# Patient Record
Sex: Male | Born: 1990 | Race: Black or African American | Hispanic: No | Marital: Married | Smoking: Former smoker
Health system: Southern US, Community
[De-identification: ages and names within clinical notes are randomized; demographics above are authoritative.]

## PROBLEM LIST (undated history)

## (undated) ENCOUNTER — Emergency Department (HOSPITAL_COMMUNITY): Payer: BLUE CROSS/BLUE SHIELD | Source: Home / Self Care

## (undated) DIAGNOSIS — R9431 Abnormal electrocardiogram [ECG] [EKG]: Secondary | ICD-10-CM

## (undated) HISTORY — DX: Abnormal electrocardiogram (ECG) (EKG): R94.31

## (undated) HISTORY — PX: ORCHIECTOMY: SHX2116

## (undated) HISTORY — PX: HERNIA REPAIR: SHX51

---

## 2015-08-30 NOTE — Progress Notes (Signed)
     HPI: 25 year old male for evaluation of abnormal electrocardiogram. 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 He was previously followed at [facility]. He went to school in [place] and now lives here. He presents for further evaluation. He has no symptoms. There is no dyspnea on exertion, orthopnea, PND, pedal edema, syncope or chest pain. He has an occasional brief flutter but no sustained palpitations.  No current outpatient prescriptions on file.   No current facility-administered medications for this visit.    No Known Allergies   Past Medical History  Diagnosis Date  . Abnormal ECG     Past Surgical History  Procedure Laterality Date  . Hernia repair      Social History   Social History  . Marital Status: Single    Spouse Name: N/A  . Number of Children: N/A  . Years of Education: N/A   Occupational History  . Not on file.   Social History Main Topics  . Smoking status: Former Research scientist (life sciences)  . Smokeless tobacco: Never Used  . Alcohol Use: 0.0 oz/week    0 Standard drinks or equivalent per week     Comment: Occasional  . Drug Use: No  . Sexual Activity: Not on file   Other Topics Concern  . Not on file   Social History Narrative  . No narrative on file    Family History  Problem Relation Age of Onset  . Hypertension Mother   . Hypertension Father   . Heart disease Mother     Palpitations    ROS: no fevers or chills, productive cough, hemoptysis, dysphasia, odynophagia, melena, hematochezia, dysuria, hematuria, rash, seizure activity, orthopnea, PND, pedal edema, claudication. Remaining systems are negative.  Physical Exam:   Blood pressure 124/86, pulse 69, height 5\' 9"  (1.753 m), weight 201 lb (91.173 kg).  General:  Well developed/well nourished in NAD Skin warm/dry, tattoos Patient not depressed No peripheral clubbing Back-normal HEENT-normal/normal eyelids Neck supple/normal carotid upstroke bilaterally; no  bruits; no JVD; no thyromegaly chest - CTA/ normal expansion CV - RRR/normal S1 and S2; no murmurs, rubs or gallops;  PMI nondisplaced Abdomen -NT/ND, no HSM, no mass, + bowel sounds, no bruit 2+ femoral pulses, no bruits Ext-no edema, chords, 2+ DP Neuro-grossly nonfocal  ECG Sinus rhythm at a rate of 69. First-degree AV block with markedly prolonged PR interval of 0.4 ms.

## 2015-09-01 ENCOUNTER — Encounter: Payer: Self-pay | Admitting: Cardiology

## 2015-09-01 ENCOUNTER — Ambulatory Visit (INDEPENDENT_AMBULATORY_CARE_PROVIDER_SITE_OTHER): Payer: BLUE CROSS/BLUE SHIELD | Admitting: Cardiology

## 2015-09-01 VITALS — BP 124/86 | HR 69 | Ht 69.0 in | Wt 201.0 lb

## 2015-09-01 DIAGNOSIS — R9431 Abnormal electrocardiogram [ECG] [EKG]: Secondary | ICD-10-CM

## 2015-09-01 DIAGNOSIS — I440 Atrioventricular block, first degree: Secondary | ICD-10-CM | POA: Diagnosis not present

## 2015-09-01 NOTE — Patient Instructions (Addendum)
Medication Instructions:   NONE ORDER TODAY   If you need a refill on your cardiac medications before your next appointment, please call your pharmacy.  Labwork: NONE ORDER TODAY    Testing/Procedures:  Your physician has requested that you have an echocardiogram. Echocardiography is a painless test that uses sound waves to create images of your heart. It provides your doctor with information about the size and shape of your heart and how well your heart's chambers and valves are working. This procedure takes approximately one hour. There are no restrictions for this procedure.   Follow-Up: Your physician wants you to follow-up in: [place] will receive a reminder letter in the mail two months in advance. If you don't receive a letter, please call our office to schedule the follow-up appointment.     Any Other Special Instructions Will Be Listed Below (If Applicable).

## 2015-09-01 NOTE — Assessment & Plan Note (Signed)
Patient has a markedly prolonged PR interval. However he is having no symptoms. There is no history of syncope. He was followed at [facility] and apparently had echoes and monitors in the past that were unremarkable. We will obtain all previous records. I will plan to repeat echocardiogram. Given that it has been present for approximately 10 years it is unlikely that he has sarcoid, hemochromatosis, Lyme disease or other such contributing factors.

## 2015-09-13 ENCOUNTER — Other Ambulatory Visit: Payer: Self-pay

## 2015-09-13 ENCOUNTER — Other Ambulatory Visit (HOSPITAL_COMMUNITY): Payer: Self-pay

## 2015-09-13 ENCOUNTER — Ambulatory Visit (HOSPITAL_COMMUNITY): Payer: BLUE CROSS/BLUE SHIELD | Attending: Cardiology

## 2015-09-13 DIAGNOSIS — R9431 Abnormal electrocardiogram [ECG] [EKG]: Secondary | ICD-10-CM | POA: Diagnosis not present

## 2015-09-15 ENCOUNTER — Encounter: Payer: Self-pay | Admitting: Cardiology

## 2015-09-15 NOTE — Telephone Encounter (Signed)
Pt would like echo results from 09-13-15 please.

## 2015-09-15 NOTE — Telephone Encounter (Signed)
This encounter was created in error - please disregard.

## 2015-11-15 ENCOUNTER — Encounter (HOSPITAL_COMMUNITY): Payer: Self-pay | Admitting: *Deleted

## 2015-11-15 ENCOUNTER — Emergency Department (HOSPITAL_COMMUNITY): Payer: Worker's Compensation

## 2015-11-15 ENCOUNTER — Emergency Department (HOSPITAL_COMMUNITY)
Admission: EM | Admit: 2015-11-15 | Discharge: 2015-11-15 | Disposition: A | Discharge status: Home / Self Care | Payer: Worker's Compensation | Attending: Emergency Medicine | Admitting: Emergency Medicine

## 2015-11-15 DIAGNOSIS — W208XXA Other cause of strike by thrown, projected or falling object, initial encounter: Secondary | ICD-10-CM | POA: Insufficient documentation

## 2015-11-15 DIAGNOSIS — Y9289 Other specified places as the place of occurrence of the external cause: Secondary | ICD-10-CM | POA: Insufficient documentation

## 2015-11-15 DIAGNOSIS — Y9389 Activity, other specified: Secondary | ICD-10-CM | POA: Diagnosis not present

## 2015-11-15 DIAGNOSIS — Z87891 Personal history of nicotine dependence: Secondary | ICD-10-CM | POA: Insufficient documentation

## 2015-11-15 DIAGNOSIS — Y990 Civilian activity done for income or pay: Secondary | ICD-10-CM | POA: Insufficient documentation

## 2015-11-15 DIAGNOSIS — S68119A Complete traumatic metacarpophalangeal amputation of unspecified finger, initial encounter: Secondary | ICD-10-CM

## 2015-11-15 DIAGNOSIS — S68120A Partial traumatic metacarpophalangeal amputation of right index finger, initial encounter: Secondary | ICD-10-CM | POA: Insufficient documentation

## 2015-11-15 DIAGNOSIS — Z23 Encounter for immunization: Secondary | ICD-10-CM | POA: Insufficient documentation

## 2015-11-15 DIAGNOSIS — S68129A Partial traumatic metacarpophalangeal amputation of unspecified finger, initial encounter: Secondary | ICD-10-CM

## 2015-11-15 LAB — CBC WITH DIFFERENTIAL/PLATELET
BASOS ABS: 0 10*3/uL (ref 0.0–0.1)
Basophils Relative: 0 %
EOS PCT: 0 %
Eosinophils Absolute: 0 10*3/uL (ref 0.0–0.7)
HEMATOCRIT: 45.1 % (ref 39.0–52.0)
HEMOGLOBIN: 15.6 g/dL (ref 13.0–17.0)
LYMPHS PCT: 28 %
Lymphs Abs: 1.4 10*3/uL (ref 0.7–4.0)
MCH: 31.1 pg (ref 26.0–34.0)
MCHC: 34.6 g/dL (ref 30.0–36.0)
MCV: 90 fL (ref 78.0–100.0)
MONO ABS: 0.3 10*3/uL (ref 0.1–1.0)
MONOS PCT: 6 %
Neutro Abs: 3.4 10*3/uL (ref 1.7–7.7)
Neutrophils Relative %: 66 %
Platelets: 140 10*3/uL — ABNORMAL LOW (ref 150–400)
RBC: 5.01 MIL/uL (ref 4.22–5.81)
RDW: 12.8 % (ref 11.5–15.5)
WBC: 5.2 10*3/uL (ref 4.0–10.5)

## 2015-11-15 LAB — BASIC METABOLIC PANEL
ANION GAP: 10 (ref 5–15)
BUN: 15 mg/dL (ref 6–20)
CHLORIDE: 104 mmol/L (ref 101–111)
CO2: 26 mmol/L (ref 22–32)
Calcium: 9.8 mg/dL (ref 8.9–10.3)
Creatinine, Ser: 1.26 mg/dL — ABNORMAL HIGH (ref 0.61–1.24)
GFR calc Af Amer: >60 mL/min (ref >60)
GLUCOSE: 98 mg/dL (ref 65–99)
POTASSIUM: 4.7 mmol/L (ref 3.5–5.1)
Sodium: 140 mmol/L (ref 135–145)

## 2015-11-15 MED ORDER — SODIUM CHLORIDE 0.9 % IV BOLUS (SEPSIS)
1000.0000 mL | Freq: Once | INTRAVENOUS | Status: AC
  Administered 2015-11-15: 1000 mL via INTRAVENOUS

## 2015-11-15 MED ORDER — MORPHINE SULFATE (PF) 2 MG/ML IV SOLN
2.0000 mg | Freq: Once | INTRAVENOUS | Status: AC
  Administered 2015-11-15: 2 mg via INTRAVENOUS
  Filled 2015-11-15: qty 1

## 2015-11-15 MED ORDER — OXYCODONE-ACETAMINOPHEN 5-325 MG PO TABS
2.0000 | ORAL_TABLET | Freq: Once | ORAL | Status: AC
  Administered 2015-11-15: 2 via ORAL
  Filled 2015-11-15: qty 2

## 2015-11-15 MED ORDER — OXYCODONE HCL 5 MG PO TABS
5.0000 mg | ORAL_TABLET | Freq: Once | ORAL | Status: AC
  Administered 2015-11-15: 5 mg via ORAL
  Filled 2015-11-15: qty 1

## 2015-11-15 MED ORDER — CEFAZOLIN SODIUM-DEXTROSE 2-4 GM/100ML-% IV SOLN
2.0000 g | Freq: Once | INTRAVENOUS | Status: AC
  Administered 2015-11-15: 2 g via INTRAVENOUS
  Filled 2015-11-15: qty 100

## 2015-11-15 MED ORDER — TETANUS-DIPHTH-ACELL PERTUSSIS 5-2.5-18.5 LF-MCG/0.5 IM SUSP
0.5000 mL | Freq: Once | INTRAMUSCULAR | Status: AC
  Administered 2015-11-15: 0.5 mL via INTRAMUSCULAR
  Filled 2015-11-15: qty 0.5

## 2015-11-15 MED ORDER — LIDOCAINE HCL (CARDIAC) 20 MG/ML IV SOLN: 1.5000 mg/kg | Freq: Once | INTRAVENOUS | Status: DC

## 2015-11-15 MED ORDER — LIDOCAINE HCL 2 % IJ SOLN
15.0000 mL | Freq: Once | INTRAMUSCULAR | Status: AC
  Administered 2015-11-15: 300 mg
  Filled 2015-11-15: qty 20

## 2015-11-15 NOTE — ED Provider Notes (Signed)
CSN: [id number]     Arrival date & time 11/15/15  1826 History  By signing my name below, I, Nicole Kindred, attest that this documentation has been prepared under the direction and in the presence of Lorayne Bender, PA-C  Electronically Signed: Nicole Kindred, ED Scribe 11/15/2015 at 9:50 PM.   Chief Complaint  Patient presents with  . Finger Injury   The history is provided by the patient. No language interpreter was used.   HPI Comments: Victor Benton is a 25 y.o. male who presents to the Emergency Department complaining of sudden onset, right finger injury that occurred about an hour prior to arrival. Pt states his finger was smashed by a steel bundle at work. Pt reports associated pain to the finger. No other associated symptoms noted. No worsening or alleviating factors noted. Pt denies other injuries, neuro deficits, or any other complaints.   Past Medical History  Diagnosis Date  . Abnormal ECG    Past Surgical History  Procedure Laterality Date  . Hernia repair     Family History  Problem Relation Age of Onset  . Hypertension Mother   . Hypertension Father   . Heart disease Mother     Palpitations   Social History  Substance Use Topics  . Smoking status: Former Research scientist (life sciences)  . Smokeless tobacco: Never Used  . Alcohol Use: 0.0 oz/week    0 Standard drinks or equivalent per week     Comment: Occasional    Review of Systems  Skin: Positive for wound.  Neurological: Negative for numbness.    Allergies  Review of patient's allergies indicates no known allergies.  Home Medications   Prior to Admission medications   Not on File   BP 122/56 mmHg  Pulse 74  Temp(Src) 98.9 F (37.2 C) (Oral)  Resp 20  Wt 87.544 kg  SpO2 99% Physical Exam  Constitutional: He is oriented to person, place, and time. He appears well-developed and well-nourished. No distress.  HENT:  Head: Normocephalic and atraumatic.  Eyes: Conjunctivae are normal.  Neck: Neck supple.   Cardiovascular: Normal rate and regular rhythm.   Pulmonary/Chest: Effort normal.  Abdominal: He exhibits no distension.  Musculoskeletal:  Complete amputation of right second digit distal to DIP joint. Tip of the phalange exposed. Bleeding is controlled. Range of motion is intact in the DIP joint. Circulation is intact. No injury noted to the patient's hand.  Neurological: He is alert and oriented to person, place, and time.  Sensory intact.  Skin: Skin is warm and dry. He is not diaphoretic.  Psychiatric: He has a normal mood and affect.  Nursing note and vitals reviewed.   ED Course  Procedures (including critical care time) DIAGNOSTIC STUDIES: Oxygen Saturation is 100% on RA, normal by my interpretation.    COORDINATION OF CARE: 6:50 PM-Discussed treatment plan which includes CBC with differential, BMP, DG hand complete right with pt at bedside and pt agreed to plan.   Labs Review Labs Reviewed  CBC WITH DIFFERENTIAL/PLATELET - Abnormal; Notable for the following:    Platelets 140 (*)    All other components within normal limits  BASIC METABOLIC PANEL - Abnormal; Notable for the following:    Creatinine, Ser 1.26 (*)    All other components within normal limits    Imaging Review Dg Hand Complete Right  11/15/2015  CLINICAL DATA:  Partial amputation to tip of second finger of right hand after injury from heavy steel object. Initial encounter. EXAM: RIGHT HAND - COMPLETE  3+ VIEW COMPARISON:  None. FINDINGS: There is circumferential amputation of the soft tissues of the distal right second finger. The tuft of the distal phalanx is intact and exposed to air. There is no visible fracture or soft tissue foreign body. The rest of the hand is normal. Probable old injury at the tip of the ulnar styloid. IMPRESSION: Circumferential amputation of the distal soft tissues of the right second finger without fracture or soft tissue foreign body. The tuft of the distal phalanx is exposed to air.  Electronically Signed   By: Aletta Edouard M.D.   On: 11/15/2015 19:24   I have personally reviewed and evaluated these images and lab results as part of my medical decision-making.   EKG Interpretation None           MDM  Treatment plan includes DG hand complete right, CBC with differential, BMP, percocet/roxicet, and Tdap.    Final diagnoses:  Amputation of finger tip, initial encounter    Victor Benton presents with a partial finger amputation that occurred about an hour prior to arrival.   Findings and plan of care discussed with Charlesetta Shanks, MD.   Patient is a nonsmoker. Tetanus updated here in the ED. Fingertip was placed in a sterile vessel and cooled externally with ice.Tip of the finger was covered with Xeroform gauze. Labs obtained in anticipation of possible surgery. 7:33 PM Spoke with Dr. Amedeo Plenty, hand surgeon, who advised that he will be by to look at the patient's wound. Recommended IV and 2 g Ancef. No further instructions. Patient given prescriptions for Cipro and OxyIR. Patient to follow up with Dr. Amedeo Plenty in the office in 10-14 days. Dr. Vanetta Shawl office will call the patient to set up an appointment. Wound care and return precautions discussed. Patient voiced understanding of these instructions and is comfortable with discharge.  Filed Vitals:   11/15/15 1859 11/15/15 2148  BP: 153/80 122/56  Pulse: 63 74  Temp: 98.5 F (36.9 C) 98.9 F (37.2 C)  TempSrc: Oral Oral  Resp: 18 20  Weight: 87.544 kg   SpO2: 100% 99%     I personally performed the services described in this documentation, which was scribed in my presence. The recorded information has been reviewed and is accurate.   Lorayne Bender, PA-C 11/15/15 2150  Charlesetta Shanks, MD 11/16/15 [phone]

## 2015-11-15 NOTE — Discharge Instructions (Signed)
You have been seen today for a finger tip amputation. Your wound was repaired here in the ED by the hand surgeon, Dr. Amedeo Plenty. You should see Dr. Amedeo Plenty in the office in 10-14 days. His office will call you to set up this appointment. Take prescriptions as prescribed by Dr. Amedeo Plenty: Cipro, an antibiotic, and Oxycodone IR, a pain medication. Do not drive or perform other dangerous activities while using the oxycodone. Keep the area clean and dry. Follow up with PCP as needed. Return to ED should symptoms worsen.  RESOURCE GUIDE  Chronic Pain Problems: Contact [facility]  [phone] Patients need to be referred by their primary care doctor.  Insufficient Money for Medicine: Contact United Way:  call "211" or [place] [phone].  No Primary Care Doctor: - Call Health Connect  [phone] - can help you locate a primary care doctor that  accepts your insurance, provides certain services, etc. - Physician Referral Service- [phone]  Agencies that provide inexpensive medical care: - [facility]  [facility]  [phone] - [facility]  [phone] - [facility]  [phone] - Planned Parenthood  [phone] - [facility]  [phone]  [place] Providers: - [facility]- [address]  [phone], Mon-Fri 9am-7pm, Sat 9am-1pm - [place], [address] [place]  [place], [address] [place]  [place]- [address]  [place], [address], [phone]  Only accepts [place] patients after they have their name  applied to their card  Self Pay (no insurance) in [place]: - Sickle Cell Patients: Dr Kevan Ny, [facility]  [place], [facility]- [place]  [place] [facility] [place]- [id number] [place] [address], [facility]- see information above (Speak to [employer] if you do not have insurance)       -  [facility]- [place], [place],  [place], [place]  Dr Vista Lawman-  [address], [place], [place] [facility]- [address] [phone]       -  [facility]- [address], [place], also [address] [phone]       -    [facility]- [address], [place], 1st & 3rd Saturday   every month, 10am-1pm  1) Find a Doctor and Pay Out of Pocket Although you won't have to find out who is covered by your insurance plan, it is a good idea to ask around and get recommendations. You will then need to call the office and see if the doctor you have chosen will accept you as a new patient and what types of options they offer for patients who are self-pay. Some doctors offer discounts or will set up payment plans for their patients  who do not have insurance, but you will need to ask so you aren't surprised when you get to your appointment.  2) Contact Your Local Health Department Not all health departments have doctors that can see patients for sick visits, but many do, so it is worth a call to see if yours does. If you don't know where your local health department is, you can check in your phone book. The CDC also has a tool to help you locate your state's health department, and many state websites also have listings of all of their local health departments.  3) Find a [facility] If your illness is not likely to be very severe or complicated, you may want to try a walk in clinic. These are popping up all over the country in pharmacies, drugstores, and shopping  centers. They're usually staffed by nurse practitioners or physician assistants that have been trained to treat common illnesses and complaints. They're usually fairly quick and inexpensive. However, if you have serious medical issues or chronic medical problems, these are probably not your best option  STD Testing - [place], [facility], [address], [place], phone [phone] or [phone].  Monday - Friday, call for an appointment. - [place], [facility] Clinic, [address], [place], phone [phone] or [phone].  Monday - Friday, call for an appointment.  Abuse/Neglect: - [place] [phone] - [place] [phone] (After Hours)  Emergency Shelter:  [facility] [phone]  Maternity Homes: - Room at the [place] [phone] - [place] [phone]  MRSA Hotline #:   (865)778-7757  [facility] [place] Dept. [address]         [place] Phone:  [phone]                                  Phone:  [phone]                   Phone:  [phone]  [place], [place] in [place], [address]                                  [place] [phone] or [phone] (After Hours)   [place]  Substance Abuse Resources: - Alcohol and Drug Services  [place] [phone] - [facility]  [place] - Residential & Outpatient Substance Abuse Program  [phone]  Psychological Services: - [place]  [place], [place] [address], [place], [place]: [phone] or [phone], [employer]  Dental Assistance  If unable to pay or uninsured, contact:  [facility] or [facility]. to become qualified for the adult dental clinic.  Patients with Medicaid: [facility] [id number], [place] [address], [phone]  If unable to pay, or uninsured, contact [facility] [phone]) or [place] [phone] in [place], [place] in [facility]) to become qualified for the adult dental clinic   Other [place]- [place], [place], [place], [address], [place], [place], 2nd and 4th Thursday of the month at 6:30am.  10 clients each day by appointment, can sometimes see walk-in patients if someone does not show for an appointment. [facility]- [address] [place], [place], [address], [place], [place], [place], [address], [place] Department- [place] Department- [employer] Department- [phone]

## 2015-11-15 NOTE — ED Notes (Signed)
Pt cut off the tip of his right pointer finger, bleeding controlled, bone visible.  Last tetanus shot 2011.  Pt is in 10/10 pain.

## 2015-11-15 NOTE — ED Notes (Signed)
Gramig at bedside.

## 2015-11-15 NOTE — ED Notes (Signed)
Applied xeroform gauze per PA order

## 2015-11-16 NOTE — Op Note (Signed)
NAMEREBEL, WURTZ NO.:  [id number]  MEDICAL RECORD NO.:  [id number]  LOCATION:  TR02C                        FACILITY:  [facility]  PHYSICIAN:  Satira Anis. Annina Piotrowski, M.D.DATE OF BIRTH:  12-Sep-1990  DATE OF PROCEDURE:  11/15/2015 DATE OF DISCHARGE:  11/15/2015                              OPERATIVE REPORT   I had the pleasure to see Ibin Cipres today in the [facility] Emergency Room upon the kind referral from the emergency room staff.  Tevyn is 38.  He had his right index finger crushed between large pieces of steel today while on the job at work as the chart reflects.  He presents with a complete amputation to the distal portion of his finger with exposed bone, nail bed, and soft tissue disarray extensive in nature.  Damauri complains of pain.  He is examined at length.  He denies other injury.  He denies neck, back, chest, or abdominal pain.  PAST MEDICAL HISTORY:  Negative.  PAST SURGICAL HISTORY:  Hernia surgery.  ALLERGIES:  None.  MEDICINES:  None.  SOCIAL HISTORY:  He is a very pleasant male, who does not smoke.  He is college educated through the [employer] and [employer].  PHYSICAL EXAMINATION:  GENERAL:  Black male, alert, oriented, no acute distress. VITAL SIGNS:  Stable. HEENT:  The patient has normal HEENT examination. CHEST:  Clear. HEART:  Regular rate. ABDOMEN:  Nontender. EXTREMITIES:  Lower extremity examination is benign.  Right upper extremity examination shows index finger amputation with exposed bone and nail bed.  He has significant disarray including exposed germinal and sterile matrix as well as bony architecture being fractured.  He has the distal tip with him that was severed and this is in very poor repair and not suitable for any type of replantation or grafting.  X- rays are reviewed which do show level of amputation.  I reviewed all findings with the patient at length.  IMPRESSION:  Right index finger amputation, distal  phalanx level.  PLAN:  I have discussed with the patient the findings.  At present time, I would recommend irrigation, debridement, repair as necessary with revision, amputation, utilizing flap coverage.  The patient understands risks and benefits and desires to proceed.  We will proceed accordingly at mutual convenient time on an urgent basis.  PREOPERATIVE DIAGNOSIS:  Right index finger amputation distally.  POSTOPERATIVE DIAGNOSIS:  Right index finger amputation distally.  PROCEDURE: 1. Irrigation and debridement, excisional in nature, right index     finger including skin, subcutaneous tissue, bone, and tendon     tissue.  This was an excisional debridement with knife, curette,     and scissor. 2. Open treatment distal phalanx fracture. 3. Complex nail bed repair, right index finger including the germinal     and sterile matrix. 4. Revision and amputation of right index finger with skeletal     shortening and V-Y advancement flap (Atasoy) flap.  SURGEON:  Satira Anis. Amedeo Plenty, M.D.  ASSISTANT:  None.  ANESTHESIA:  Block anesthetic.  TOURNIQUET TIME:  Less than an hour.  INDICATIONS:  The patient is a pleasant male presents with a right index finger amputation.  Having  counseled in regard to risks and benefits surgery, he desires to proceed.  OPERATION DETAILS:  The patient was seen by myself, underwent an intermetacarpal/field block with lidocaine without epinephrine.  He was then prepped and draped in usual sterile fashion with 2 separate Betadine scrubs.  I used a Betadine scrub brush, followed by 2-3 L of saline placed pulsatile through the wound.  Sterile field was secured.  At this time, I performed additional irrigation and debridement of skin, subcutaneous tissue, bone, tendon, and peritendinous tissue.  This was an excisional debridement with curette, knife, and scissors.  Following this, I performed open treatment of the distal phalanx fracture.  Following  this, I performed skeletal shortening.  Following this, I then made an incision in the eponychial fold and repaired the germinal matrix.  Unfortunately, the germinal matrix was crushed and I would have concerns about him growing a new nail, nevertheless we repaired this under 4.5 loupe magnification without difficulty which he tolerated well.  Following this, the sterile matrix was repaired.  Following this, the skeletal shortening with rongeur ensued followed by incision with a 15 blade knife.  I performed an incision for an Atasoy flap.  The flap was mobilized accordingly.  Incision was made in a v-shaped fashion.  Neurovascular bundles carefully protected.  The flap was very carefully attended to, and following this, inset over the bone.  Combination of 5-0 and 4-0 Chromics were used.  The redundant edges at the folds were also treated. Tourniquet was deflated.  Hemostasis looked excellent.  Flap looked good with good refill.  I placed Adaptic under the eponychial fold and dressed him with additional Adaptic gauze and a finger splint.  He had a bulky dressing. He will be discharged home on Cipro 500 mg tablet p.o. b.i.d. x14 days, OxyIR written for pain, Colace twice a day, vitamin C, and our standard algorithm.  We will see him back in the office in 7 days.  He will be out of work until further notice.  I will see him in 14 days.  These notes have been discussed, and all questions have been encouraged and answered.  It was noting, but an absolute pleasure to see Victor Benton today.  He is a very nice gentleman, wishing the best.  I do think he will have some degree of impairment, but I do not feel he will have disability given his young age and propensity towards recovery as long as there are no infectious sequelae.     Satira Anis. Amedeo Plenty, M.D.     [facility]  D:  11/15/2015  T:  11/16/2015  Job:  [id number]

## 2016-06-25 ENCOUNTER — Encounter (HOSPITAL_COMMUNITY): Payer: Self-pay | Admitting: Emergency Medicine

## 2016-06-25 ENCOUNTER — Ambulatory Visit (HOSPITAL_COMMUNITY)
Admission: EM | Admit: 2016-06-25 | Discharge: 2016-06-25 | Disposition: A | Discharge status: Home / Self Care | Payer: BLUE CROSS/BLUE SHIELD | Attending: Emergency Medicine | Admitting: Emergency Medicine

## 2016-06-25 DIAGNOSIS — M62830 Muscle spasm of back: Secondary | ICD-10-CM

## 2016-06-25 NOTE — Discharge Instructions (Signed)
No abnormal findings on exam. May return to work. Recommend performing some muscle warm up exercises prior to work such as stretching back muscles prior to lifting, bending and pulling.

## 2016-06-25 NOTE — ED Triage Notes (Signed)
Lower back pain one week ago.  Describes pain as spasms.  No specific injury.  Relates this discomfort to rushing picking up things.  Patient called into work and needs a work note

## 2016-06-25 NOTE — ED Provider Notes (Signed)
CSN: [id number]     Arrival date & time 06/25/16  1509 History   First MD Initiated Contact with Patient 06/25/16 1740     Chief Complaint  Patient presents with  . Back Pain   (Consider location/radiation/quality/duration/timing/severity/associated sxs/prior Treatment) 25 year old male states that last week on the job developed back spasms. He was out for a short time and his back but has not got better. He one to go back to work but his supervisor require that he see a PCP. He states now that he has no pain. No spasms or pain with range of motion, movement, lifting or any other activities. He is here for note to go back to work. His job is working with Engineer, materials.      Past Medical History:  Diagnosis Date  . Abnormal ECG    Past Surgical History:  Procedure Laterality Date  . HERNIA REPAIR     Family History  Problem Relation Age of Onset  . Hypertension Mother   . Heart disease Mother     Palpitations  . Hypertension Father    Social History  Substance Use Topics  . Smoking status: Former Research scientist (life sciences)  . Smokeless tobacco: Never Used  . Alcohol use 0.0 oz/week     Comment: Occasional    Review of Systems  Constitutional: Negative.   HENT: Negative.   Respiratory: Negative.   Gastrointestinal: Negative.   Musculoskeletal: Negative.   Neurological: Negative.   All other systems reviewed and are negative.   Allergies  Patient has no known allergies.  Home Medications   Prior to Admission medications   Not on File   Meds Ordered and Administered this Visit  Medications - No data to display  BP 135/75 (BP Location: Left Arm) Comment (BP Location): large cuff  Pulse 65   Temp 98.4 F (36.9 C) (Oral)   Resp 18   SpO2 100%  No data found.   Physical Exam  Constitutional: He is oriented to person, place, and time. He appears well-developed and well-nourished. No distress.  HENT:  Head: Normocephalic and atraumatic.  Neck: Normal range of motion. Neck supple.   Pulmonary/Chest: Effort normal.  Musculoskeletal: Normal range of motion. He exhibits no edema, tenderness or deformity.  No tenderness to the back. No tenderness to palpation or percussion. Demonstrates full range of motion with flexion and extension and bilateral movement. No spinal tenderness to palpation, deformity, discoloration or step-off deformity. Back exam is normal. Distal neurovascular motor Sentry is grossly intact. Lower extremity strength is intact. Normal gait and balance.  Neurological: He is alert and oriented to person, place, and time. No cranial nerve deficit or sensory deficit.  Skin: Skin is warm and dry. No rash noted.  Nursing note and vitals reviewed.   Urgent Care Course   Clinical Course     Procedures (including critical care time)  Labs Review Labs Reviewed - No data to display  Imaging Review No results found.   Visual Acuity Review  Right Eye Distance:   Left Eye Distance:   Bilateral Distance:    Right Eye Near:   Left Eye Near:    Bilateral Near:         MDM   1. Muscle spasm of back    Symptoms resolved. Exam of the back is normal. Exhibits no limitations. Normal strength and range of motion. May return to work.    Janne Napoleon, NP 06/25/16 1750

## 2016-11-03 ENCOUNTER — Ambulatory Visit (INDEPENDENT_AMBULATORY_CARE_PROVIDER_SITE_OTHER): Payer: BLUE CROSS/BLUE SHIELD | Admitting: Physician Assistant

## 2016-11-03 VITALS — BP 118/84 | HR 95 | Temp 98.4°F | Resp 16 | Ht 70.0 in | Wt 213.8 lb

## 2016-11-03 DIAGNOSIS — L989 Disorder of the skin and subcutaneous tissue, unspecified: Secondary | ICD-10-CM | POA: Diagnosis not present

## 2016-11-03 DIAGNOSIS — D229 Melanocytic nevi, unspecified: Secondary | ICD-10-CM | POA: Diagnosis not present

## 2016-11-03 NOTE — Patient Instructions (Addendum)
WOUND CARE. Marland Kitchen After 24 hours, remove bandage and wash wound gently with mild soap and warm water. Reapply a new bandage after cleaning wound, if directed. . Continue daily cleansing with soap and water. . Notify the office if you experience any of the following signs of infection: Swelling, redness, pus drainage, streaking, fever >101.0 F . Notify the office if you experience excessive bleeding that does not stop after 15-20 minutes of constant, firm pressure.   We will contact you with your lab results. Please let me know if you need [place] to fill out any forms once we have the results. Thank you for letting me participate in your health and well being.   IF you received an x-ray today, you will receive an invoice from [facility] Radiology. Please contact [facility]iology at [phone] with questions or concerns regarding your invoice.   IF you received labwork today, you will receive an invoice from [facility]. Please contact [facility] at [phone] with questions or concerns regarding your invoice.   Our billing staff will not be able to assist you with questions regarding bills from these companies.  You will be contacted with the lab results as soon as they are available. The fastest way to get your results is to activate your My Chart account. Instructions are located on the last page of this paperwork. If you have not heard from [place] regarding the results in 2 weeks, please contact this office.

## 2016-11-03 NOTE — Progress Notes (Signed)
   Victor Benton  MRN: [id number] DOB: Jan 01, 1991  Subjective:  Victor Benton is a 26 y.o. male seen in office today for a chief complaint of mole and birthmark on back. It has always been there but he is wanting further evaluation and requests a biopsy of both. Denies pain, itching, changes in color, and change in size.   Review of Systems  Constitutional: Negative for chills, diaphoresis and fever.    Patient Active Problem List   Diagnosis Date Noted  . 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   No current facility-administered medications on file prior to visit.     No Known Allergies   Objective:  BP 118/84   Pulse 95   Temp 98.4 F (36.9 C)   Resp 16   Ht 5\' 10"  (1.778 m)   Wt 213 lb 12.8 oz (97 kg)   SpO2 99%   BMI 30.68 kg/m   Physical Exam  Constitutional: He is oriented to person, place, and time and well-developed, well-nourished, and in no distress.  HENT:  Head: Normocephalic and atraumatic.  Eyes: Conjunctivae are normal.  Neck: Normal range of motion.  Pulmonary/Chest: Effort normal.  Neurological: He is alert and oriented to person, place, and time. Gait normal.  Skin: Skin is warm and dry.  1 cm assymetrical brown macular lesion noted on mid aspect of lower posterior trunk.    29 cm assymetical hyperpigmented macular  lesion on left shoulder extending to left forearm.  Psychiatric: Affect normal.  Vitals reviewed.  PROCEDURE NOTE: Punch biopsy Verbal consent obtain from the patient.  Two sites were cleansed with alcohol pad, then local anesthesia with 1 cc 1% lidocaine with epinephrine.  A 95mm punch biopsy performed on two sites and two specimens were sent for pathology review.Hemostasis achieved with firm pressure. Bandage applied. Pt tolerated well. Local wound care reviewed.  Assessment and Plan :  1. Skin lesions Await lab results, wound care instructions given.  - Dermatology  pathology - Dermatology pathology   Tenna Delaine PA-C  [facility] 11/03/2016 10:06 AM

## 2016-11-12 ENCOUNTER — Telehealth: Payer: Self-pay | Admitting: Physician Assistant

## 2016-11-12 NOTE — Telephone Encounter (Signed)
Please advised  

## 2016-11-12 NOTE — Telephone Encounter (Signed)
[facility] - Pt wants to know his pathology results on mole/tissue sample that was  Taken 11-03-16.  Please call asap [phone].

## 2016-11-12 NOTE — Telephone Encounter (Signed)
l/m with benign result

## 2016-11-12 NOTE — Telephone Encounter (Signed)
Please call pt and let him know the results read as: 1. ,2. There is benign melanocytic hyperplasia and hyperpigmentation along the basal layer of the epidermis. There is no atypia.  This just means that both lesions are benign. Can we also send the derm pathology report to the patient? If he needs me to fill out any paperwork regarding this, please let me know. Thanks!

## 2018-03-16 IMAGING — CR DG HAND COMPLETE 3+V*R*
3 series · 3 of 3 positions shown · non-contrast
Comparison: None.

CLINICAL DATA: Partial amputation to tip of second finger of right
hand after injury from heavy steel object. Initial encounter.

EXAM:
RIGHT HAND - COMPLETE 3+ VIEW

[hand pa]
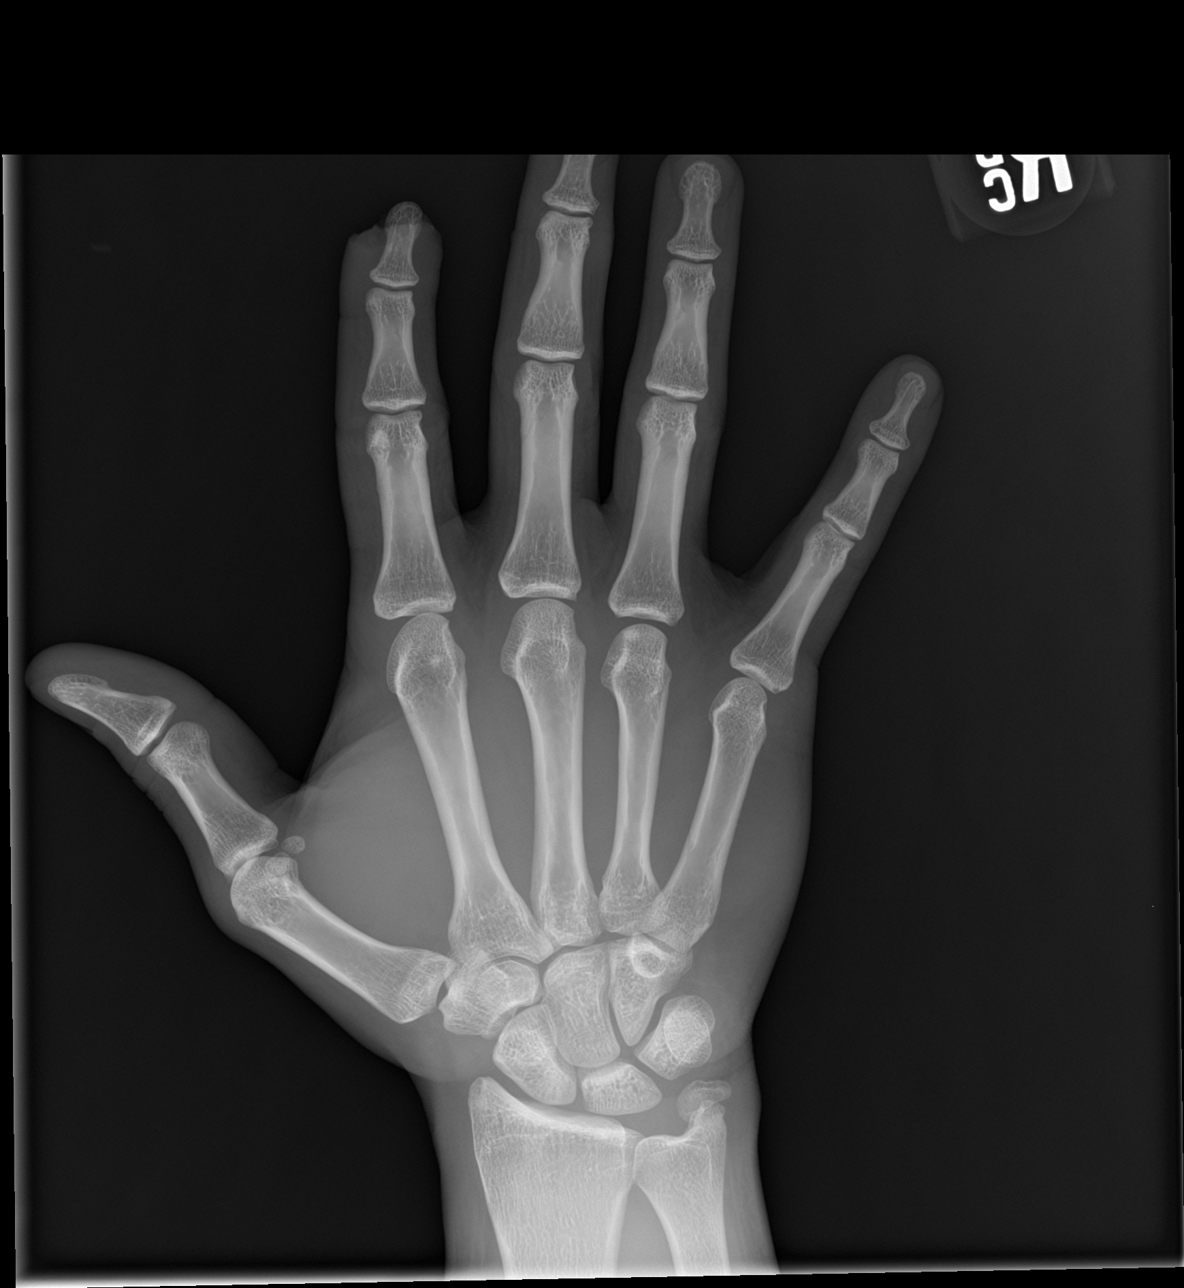

[hand obl]
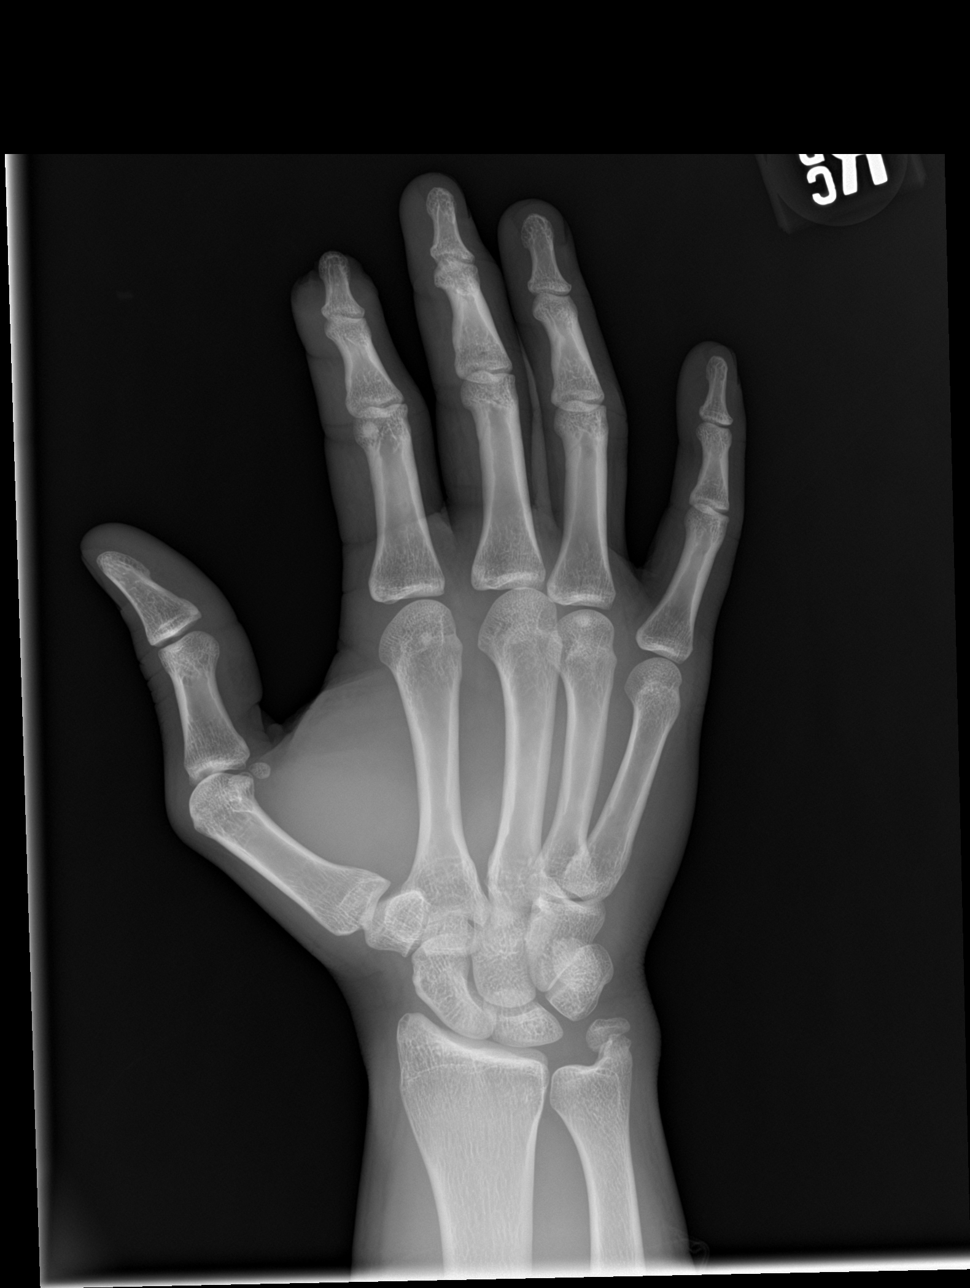

[hand lat]
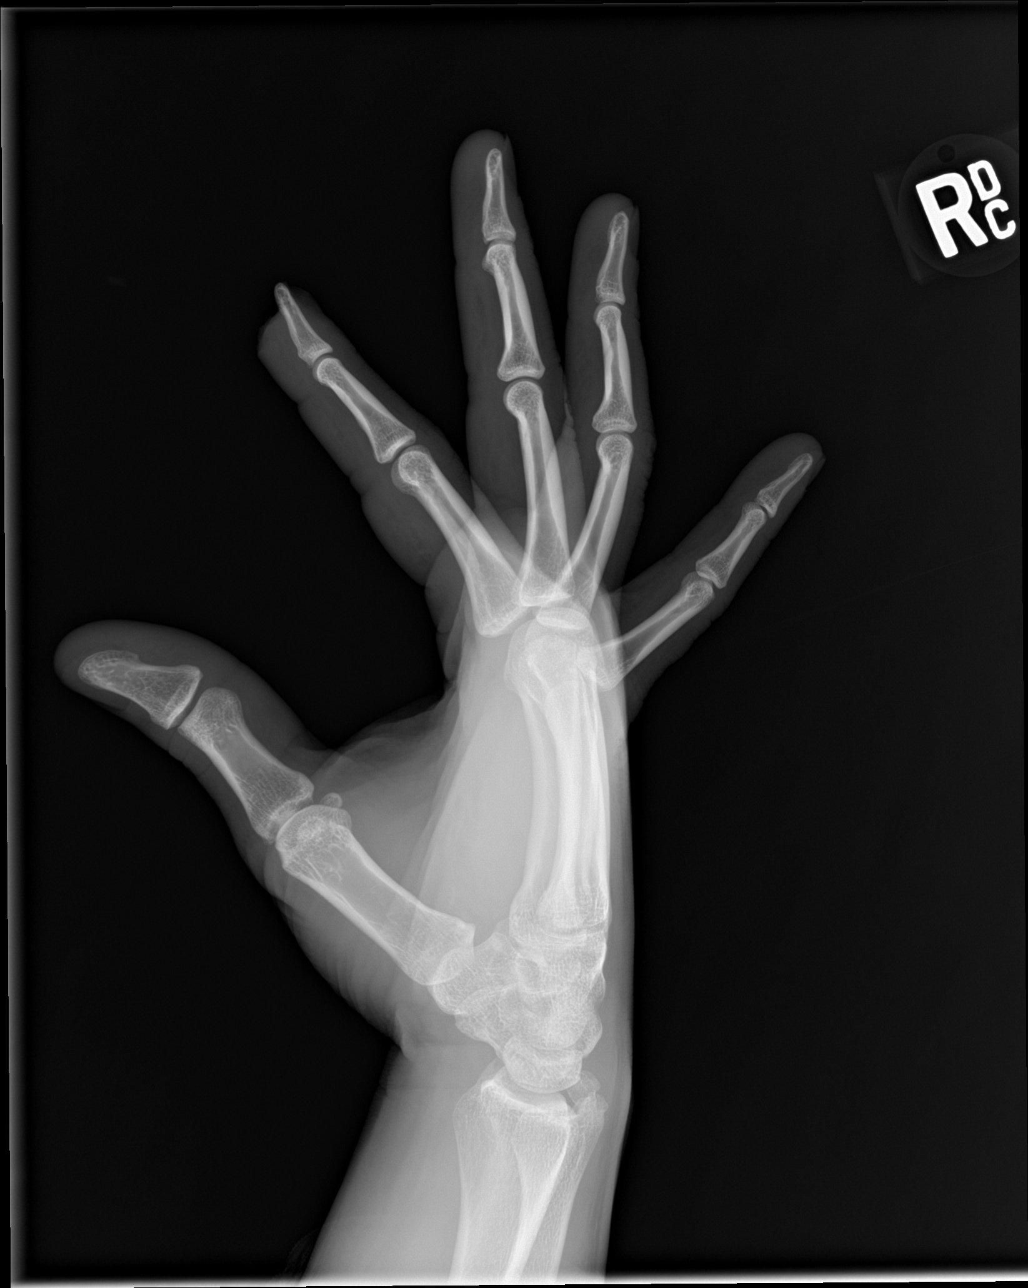

[3 of 3 positions shown; findings below may reference images not displayed]

FINDINGS: There is circumferential amputation of the soft tissues of the
distal right second finger. The tuft of the distal phalanx is intact
and exposed to air. There is no visible fracture or soft tissue
foreign body. The rest of the hand is normal. Probable old injury at
the tip of the ulnar styloid.
IMPRESSION: Circumferential amputation of the distal soft tissues of the right
second finger without fracture or soft tissue foreign body. The tuft
of the distal phalanx is exposed to air.

## 2022-01-15 ENCOUNTER — Encounter: Payer: Self-pay | Admitting: Emergency Medicine

## 2022-01-15 ENCOUNTER — Ambulatory Visit
Admission: EM | Admit: 2022-01-15 | Discharge: 2022-01-15 | Disposition: A | Discharge status: Home / Self Care | Payer: TRICARE For Life (TFL) | Attending: Nurse Practitioner | Admitting: Nurse Practitioner

## 2022-01-15 DIAGNOSIS — R0981 Nasal congestion: Secondary | ICD-10-CM

## 2022-01-15 DIAGNOSIS — J301 Allergic rhinitis due to pollen: Secondary | ICD-10-CM | POA: Diagnosis not present

## 2022-01-15 MED ORDER — DEXAMETHASONE SODIUM PHOSPHATE 10 MG/ML IJ SOLN
10.0000 mg | Freq: Once | INTRAMUSCULAR | Status: AC
  Administered 2022-01-15: 10 mg via INTRAMUSCULAR

## 2022-01-15 MED ORDER — CHLORPHEN-PE-ACETAMINOPHEN 4-10-325 MG PO TABS: 1.0000 | ORAL_TABLET | Freq: Three times a day (TID) | ORAL | 0 refills | Status: AC | PRN

## 2022-01-15 MED ORDER — FLUTICASONE PROPIONATE 50 MCG/ACT NA SUSP: 2.0000 | Freq: Every day | NASAL | 0 refills | Status: AC

## 2022-01-15 MED ORDER — CETIRIZINE HCL 10 MG PO TABS: 10.0000 mg | ORAL_TABLET | Freq: Every day | ORAL | 0 refills | Status: AC

## 2022-01-15 NOTE — ED Triage Notes (Signed)
Pt here with nasal congestion and sinus pressure x 10 days.

## 2022-01-15 NOTE — ED Provider Notes (Signed)
[facility]    CSN: [id number] Arrival date & time: 01/15/22  1014      History   Chief Complaint Chief Complaint  Patient presents with   Nasal Congestion    HPI Victor Benton is a 31 y.o. male.   Subjective:   Victor Benton is a 31 y.o. male who presents for evaluation and treatment of allergic symptoms.  Symptoms include nasal congestion, rhinorrhea, sneezing, morning sore throat, and cough.  Patient tried mucinex, theraflu and Coricidin for his symptoms with significant improvement.  He stopped the medications because he felt better but unfortunately his symptoms returned.  Patient reports that his symptoms feel very similar to when he had COVID back in January 2021.  Patient current symptoms have been ongoing for the past 9 to 10 days.  Patient took a home COVID test 2 days ago and it was negative.  The following portions of the patient's history were reviewed and updated as appropriate: allergies, current medications, past family history, past medical history, past social history, past surgical history, and problem list.     Past Medical History:  Diagnosis Date   Abnormal ECG     Patient Active Problem List   Diagnosis Date Noted   First degree AV block 09/01/2015    Past Surgical History:  Procedure Laterality Date   HERNIA REPAIR         Home Medications    Prior to Admission medications   Medication Sig Start Date End Date Taking? Authorizing Provider  cetirizine (ZYRTEC) 10 MG tablet Take 1 tablet (10 mg total) by mouth daily. 01/15/22  Yes Enrique Sack, FNP  Chlorphen-PE-Acetaminophen 4-10-325 MG TABS Take 1 tablet by mouth 3 (three) times daily as needed (allergy symptoms/nasal congestion). 01/15/22  Yes Enrique Sack, FNP  fluticasone (FLONASE) 50 MCG/ACT nasal spray Place 2 sprays into both nostrils daily. 01/15/22  Yes Enrique Sack, FNP    Family History Family History  Problem Relation Age of Onset   Hypertension Mother     Heart disease Mother        Palpitations   Hypertension Father     Social History Social History   Tobacco Use   Smoking status: Former   Smokeless tobacco: Never  Substance Use Topics   Alcohol use: Yes    Alcohol/week: 0.0 standard drinks    Comment: Occasional   Drug use: No     Allergies   Aspirin, Motrin [ibuprofen], Nitrofurantoin, Primaquine, and Sulfamethoxazole-trimethoprim   Review of Systems Review of Systems  Constitutional:  Negative for fever.  HENT:  Positive for congestion, rhinorrhea, sneezing and sore throat.   Eyes:  Negative for pain, discharge, redness and itching.  Respiratory:  Positive for cough. Negative for shortness of breath.   Musculoskeletal:  Negative for myalgias.  Neurological:  Negative for headaches.  All other systems reviewed and are negative.   Physical Exam Triage Vital Signs ED Triage Vitals  Enc Vitals Group     BP 01/15/22 1048 113/76     Pulse Rate 01/15/22 1046 (!) 55     Resp 01/15/22 1046 18     Temp 01/15/22 1046 97.9 F (36.6 C)     Temp src --      SpO2 01/15/22 1046 98 %     Weight --      Height --      Head Circumference --      Peak Flow --      Pain Score 01/15/22 1048 2  Pain Loc --      Pain Edu? --      Excl. in [place]? --    No data found.  Updated Vital Signs BP 113/76   Pulse (!) 55   Temp 97.9 F (36.6 C)   Resp 18   SpO2 98%   Visual Acuity Right Eye Distance:   Left Eye Distance:   Bilateral Distance:    Right Eye Near:   Left Eye Near:    Bilateral Near:     Physical Exam Vitals and nursing note reviewed.  Constitutional:      General: He is not in acute distress.    Appearance: Normal appearance. He is not ill-appearing, toxic-appearing or diaphoretic.  HENT:     Head: Normocephalic.     Nose: Congestion present.     Mouth/Throat:     Mouth: Mucous membranes are moist.  Eyes:     Conjunctiva/sclera: Conjunctivae normal.     Pupils: Pupils are equal, round, and reactive  to light.  Cardiovascular:     Rate and Rhythm: Normal rate.  Pulmonary:     Effort: Pulmonary effort is normal.  Abdominal:     Palpations: Abdomen is soft.  Musculoskeletal:        General: Normal range of motion.     Cervical back: Normal range of motion and neck supple.  Lymphadenopathy:     Cervical: No cervical adenopathy.  Skin:    General: Skin is warm and dry.  Neurological:     General: No focal deficit present.     Mental Status: He is alert and oriented to person, place, and time.  Psychiatric:        Mood and Affect: Mood normal.        Behavior: Behavior normal.     UC Treatments / Results  Labs (all labs ordered are listed, but only abnormal results are displayed) Labs Reviewed - No data to display  EKG   Radiology No results found.  Procedures Procedures (including critical care time)  Medications Ordered in UC Medications  dexamethasone (DECADRON) injection 10 mg (10 mg Intramuscular Given 01/15/22 1118)    Initial Impression / Assessment and Plan / UC Course  I have reviewed the triage vital signs and the nursing notes.  Pertinent labs & imaging results that were available during my care of the patient were reviewed by me and considered in my medical decision making (see chart for details).     31 year old male presenting with nasal sinus congestion and allergic rhinitis.  Patient is afebrile and nontoxic.  Decadron injection given in the clinic.  Patient prescribed Flonase, Norel AD and Zyrtec.  Advised patient to continue the Zyrtec and Flonase daily throughout the allergy season and use the Norel AD as needed for symptom relief.  Patient should drink lots of fluids as well.  Today's evaluation has revealed no signs of a dangerous process. Discussed diagnosis with patient and/or guardian. Patient and/or guardian aware of their diagnosis, possible red flag symptoms to watch out for and need for close follow up. Patient and/or guardian understands  verbal and written discharge instructions. Patient and/or guardian comfortable with plan and disposition.  Patient and/or guardian has a clear mental status at this time, good insight into illness (after discussion and teaching) and has clear judgment to make decisions regarding their care  Documentation was completed with the aid of voice recognition software. Transcription may contain typographical errors. Final Clinical Impressions(s) / UC Diagnoses   Final diagnoses:  Nasal sinus congestion  Seasonal allergic rhinitis due to pollen   Discharge Instructions   None    ED Prescriptions     Medication Sig Dispense Auth. Provider   cetirizine (ZYRTEC) 10 MG tablet Take 1 tablet (10 mg total) by mouth daily. 14 tablet Manroop Jakubowicz, Aldona Bar, FNP   fluticasone (FLONASE) 50 MCG/ACT nasal spray Place 2 sprays into both nostrils daily. 9.9 mL Enrique Sack, FNP   Chlorphen-PE-Acetaminophen 4-10-325 MG TABS Take 1 tablet by mouth 3 (three) times daily as needed (allergy symptoms/nasal congestion). 21 tablet Enrique Sack, FNP      PDMP not reviewed this encounter.   Enrique Sack, [place] 01/15/22 1723

## 2022-06-18 NOTE — Progress Notes (Signed)
Referring-[facility] Reason for referral-abnormal ECG  HPI: 31 year old male for evaluation of abnormal ECG at request of [facility].  Patient seen previously but not since 2017.  At that time he was found to have sinus rhythm with first-degree AV block with PR interval 0.4 ms.  Echocardiogram at that time showed normal LV function.  It was noted that he had been seen in [place] at [facility] with normal echoes and monitors in the past.  Patient denies dyspnea on exertion, orthopnea, PND, pedal edema, chest pain or syncope.  Occasional brief flutters but no sustained palpitations.  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  He presents for further evaluation.  Current Outpatient Medications  Medication Sig Dispense Refill   cetirizine (ZYRTEC) 10 MG tablet Take 1 tablet (10 mg total) by mouth daily. (Patient not taking: Reported on 07/02/2022) 14 tablet 0   Chlorphen-PE-Acetaminophen 4-10-325 MG TABS Take 1 tablet by mouth 3 (three) times daily as needed (allergy symptoms/nasal congestion). (Patient not taking: Reported on 07/02/2022) 21 tablet 0   fluticasone (FLONASE) 50 MCG/ACT nasal spray Place 2 sprays into both nostrils daily. (Patient not taking: Reported on 07/02/2022) 9.9 mL 0   No current facility-administered medications for this visit.    Allergies  Allergen Reactions   Aspirin     G6PD Deficiency    Motrin [Ibuprofen]    Nitrofurantoin     G6PD Deficiency    Primaquine     G6PD Deficiency    Sulfamethoxazole-Trimethoprim     G6PD Deficiency      Past Medical History:  Diagnosis Date   Abnormal ECG     Past Surgical History:  Procedure Laterality Date   HERNIA REPAIR     ORCHIECTOMY Right     Social History   Socioeconomic History   Marital status: Married    Spouse name: Not on file   Number of children: 1   Years of education: Not on file   Highest education level: Not on file  Occupational History   Not on file  Tobacco  Use   Smoking status: Former   Smokeless tobacco: Never  Substance and Sexual Activity   Alcohol use: Yes    Alcohol/week: 0.0 standard drinks of alcohol    Comment: Occasional   Drug use: No   Sexual activity: Not on file  Other Topics Concern   Not on file  Social History Narrative   Not on file   Social Determinants of Health   Financial Resource Strain: Not on file  Food Insecurity: Not on file  Transportation Needs: Not on file  Physical Activity: Not on file  Stress: Not on file  Social Connections: Not on file  Intimate Partner Violence: Not on file    Family History  Problem Relation Age of Onset   Hypertension Mother    Heart disease Mother        Palpitations   Hypertension Father     ROS: no fevers or chills, productive cough, hemoptysis, dysphasia, odynophagia, melena, hematochezia, dysuria, hematuria, rash, seizure activity, orthopnea, PND, pedal edema, claudication. Remaining systems are negative.  Physical Exam:   Blood pressure 104/76, pulse 69, height '5\' 9"'$  (1.753 m), weight 214 lb (97.1 kg), SpO2 99 %.  General:  Well developed/well nourished in NAD Skin warm/dry Patient not depressed No peripheral clubbing Back-normal HEENT-normal/normal eyelids Neck supple/normal carotid upstroke bilaterally; no bruits; no JVD; no thyromegaly chest - CTA/ normal expansion CV - RRR/normal S1 and S2;  no murmurs, rubs or gallops;  PMI nondisplaced Abdomen -NT/ND, no HSM, no mass, + bowel sounds, no bruit 2+ femoral pulses, no bruits Ext-no edema, chords, 2+ DP Neuro-grossly nonfocal  ECG -normal sinus rhythm at a rate of 69, first-degree AV block, no ST changes.  PR interval slightly shorter than previous.  Personally reviewed  A/P  1 abnormal ECG-patient has a prolonged PR interval which is slightly shorter than 2017.  He was also seen for this in [place] years ago.  He has no cardiac symptoms other than occasional brief flutters but no  sustained palpitations.  No history of syncope.  I will arrange an echocardiogram to assess LV function.  If normal no further cardiac work-up.  He would also be clear to participate in all activities in the [place] including being tased if necessary.  Note he exercises routinely with no chest pain, dyspnea and no syncope.  Kirk Ruths, MD

## 2022-07-02 ENCOUNTER — Ambulatory Visit: Attending: Cardiology | Admitting: Cardiology

## 2022-07-02 ENCOUNTER — Encounter: Payer: Self-pay | Admitting: Cardiology

## 2022-07-02 VITALS — BP 104/76 | HR 69 | Ht 69.0 in | Wt 214.0 lb

## 2022-07-02 DIAGNOSIS — R9431 Abnormal electrocardiogram [ECG] [EKG]: Secondary | ICD-10-CM

## 2022-07-02 NOTE — Patient Instructions (Signed)
  Testing/Procedures:  Your physician has requested that you have an echocardiogram. Echocardiography is a painless test that uses sound waves to create images of your heart. It provides your doctor with information about the size and shape of your heart and how well your heart's chambers and valves are working. This procedure takes approximately one hour. There are no restrictions for this procedure. Please do NOT wear cologne, perfume, aftershave, or lotions (deodorant is allowed). Please arrive 15 minutes prior to your appointment time. [place], you and your health needs are our priority.  As part of our continuing mission to provide you with exceptional heart care, we have created designated Provider Care Teams.  These Care Teams include your primary Cardiologist (physician) and Advanced Practice Providers (APPs -  Physician Assistants and Nurse Practitioners) who all work together to provide you with the care you need, when you need it.  We recommend signing up for the patient portal called "MyChart".  Sign up information is provided on this After Visit Summary.  MyChart is used to connect with patients for Virtual Visits (Telemedicine).  Patients are able to view lab/test results, encounter notes, upcoming appointments, etc.  Non-urgent messages can be sent to your provider as well.   To learn more about what you can do with MyChart, go to [email or web address].    Your next appointment:    AS NEEDED

## 2022-07-25 ENCOUNTER — Ambulatory Visit (HOSPITAL_COMMUNITY): Attending: Cardiology

## 2022-07-25 DIAGNOSIS — R9431 Abnormal electrocardiogram [ECG] [EKG]: Secondary | ICD-10-CM | POA: Diagnosis not present

## 2022-07-25 DIAGNOSIS — I517 Cardiomegaly: Secondary | ICD-10-CM | POA: Insufficient documentation

## 2022-07-25 LAB — ECHOCARDIOGRAM COMPLETE
Area-P 1/2: 5.38 cm2
S' Lateral: 3.1 cm

## 2022-07-30 ENCOUNTER — Encounter: Payer: Self-pay | Admitting: *Deleted
